# Patient Record
Sex: Male | Born: 1960 | Race: White | Hispanic: No | Marital: Single | Smoking: Never smoker
Health system: Southern US, Community
[De-identification: ages and names within clinical notes are randomized; demographics above are authoritative.]

## PROBLEM LIST (undated history)

## (undated) DIAGNOSIS — M109 Gout, unspecified: Secondary | ICD-10-CM

## (undated) DIAGNOSIS — I10 Essential (primary) hypertension: Secondary | ICD-10-CM

## (undated) DIAGNOSIS — G4730 Sleep apnea, unspecified: Secondary | ICD-10-CM

## (undated) DIAGNOSIS — E119 Type 2 diabetes mellitus without complications: Secondary | ICD-10-CM

## (undated) DIAGNOSIS — K276 Chronic or unspecified peptic ulcer, site unspecified, with both hemorrhage and perforation: Secondary | ICD-10-CM

## (undated) HISTORY — PX: NASAL POLYP SURGERY: SHX186

---

## 2020-05-30 ENCOUNTER — Other Ambulatory Visit (HOSPITAL_COMMUNITY): Payer: Self-pay | Admitting: Occupational Medicine

## 2020-05-30 ENCOUNTER — Ambulatory Visit (HOSPITAL_COMMUNITY)
Admission: RE | Admit: 2020-05-30 | Discharge: 2020-05-30 | Disposition: A | Discharge status: Home / Self Care | Source: Ambulatory Visit | Attending: Occupational Medicine | Admitting: Occupational Medicine

## 2020-05-30 ENCOUNTER — Other Ambulatory Visit: Payer: Self-pay

## 2020-05-30 DIAGNOSIS — R7612 Nonspecific reaction to cell mediated immunity measurement of gamma interferon antigen response without active tuberculosis: Secondary | ICD-10-CM | POA: Diagnosis present

## 2020-06-02 ENCOUNTER — Encounter (HOSPITAL_COMMUNITY): Payer: Self-pay

## 2020-06-02 ENCOUNTER — Ambulatory Visit (HOSPITAL_COMMUNITY)
Admission: RE | Admit: 2020-06-02 | Discharge: 2020-06-02 | Disposition: A | Discharge status: Home / Self Care | Source: Ambulatory Visit

## 2020-06-02 ENCOUNTER — Other Ambulatory Visit: Payer: Self-pay

## 2020-06-02 VITALS — BP 132/78 | HR 61 | Temp 98.1°F | Resp 18

## 2020-06-02 DIAGNOSIS — S61313A Laceration without foreign body of left middle finger with damage to nail, initial encounter: Secondary | ICD-10-CM

## 2020-06-02 NOTE — ED Provider Notes (Signed)
[facility]    CSN: [id number] Arrival date & time: 06/02/20  0841      History   Chief Complaint Chief Complaint  Patient presents with   Appointment   Laceration    HPI Mitchell Carr is a 59 y.o. male.   Patient is a 59 year old male who presents today with left middle finger avulsion.  This occurred approximately 2 days ago.  Reporting he was cutting some, and and sliced the tip of the left middle finger and part of the nail.  He has been cleaning, holding pressure to stop bleeding.  Reporting blood pretty consistently over the past day or so but seems to have stopped at this point.  He put liquid Band-Aid on the open area and wrapped with nonstick gauze and finger condom.  Has sensation to finger.  Not really having any pain.   ROS per HPI      History reviewed. No pertinent past medical history.  There are no problems to display for this patient.   History reviewed. No pertinent surgical history.     Home Medications    Prior to Admission medications   Not on File    Family History Family History  Family history unknown: Yes    Social History Social History   Tobacco Use   Smoking status: Never Smoker  Substance Use Topics   Alcohol use: Not on file   Drug use: Not on file     Allergies   Patient has no known allergies.   Review of Systems Review of Systems   Physical Exam Triage Vital Signs ED Triage Vitals  Enc Vitals Group     BP 06/02/20 0911 132/78     Pulse Rate 06/02/20 0911 61     Resp 06/02/20 0911 18     Temp 06/02/20 0911 98.1 F (36.7 C)     Temp Source 06/02/20 0911 Oral     SpO2 06/02/20 0911 98 %     Weight --      Height --      Head Circumference --      Peak Flow --      Pain Score 06/02/20 0912 4     Pain Loc --      Pain Edu? --      Excl. in GC? --    No data found.  Updated Vital Signs BP 132/78 (BP Location: Right Arm)    Pulse 61    Temp 98.1 F (36.7 C) (Oral)    Resp 18    SpO2  98%   Visual Acuity Right Eye Distance:   Left Eye Distance:   Bilateral Distance:    Right Eye Near:   Left Eye Near:    Bilateral Near:     Physical Exam Vitals and nursing note reviewed.  Constitutional:      Appearance: Normal appearance.  HENT:     Head: Normocephalic and atraumatic.     Nose: Nose normal.  Eyes:     Conjunctiva/sclera: Conjunctivae normal.  Pulmonary:     Effort: Pulmonary effort is normal.  Musculoskeletal:        General: Normal range of motion.     Cervical back: Normal range of motion.  Skin:    General: Skin is warm and dry.     Comments: Finger wrapped in gauze with finger condom in place.   Neurological:     Mental Status: He is alert.  Psychiatric:  Mood and Affect: Mood normal.      UC Treatments / Results  Labs (all labs ordered are listed, but only abnormal results are displayed) Labs Reviewed - No data to display  EKG   Radiology No results found.  Procedures Procedures (including critical care time)  Medications Ordered in UC Medications - No data to display  Initial Impression / Assessment and Plan / UC Course  I have reviewed the triage vital signs and the nursing notes.  Pertinent labs & imaging results that were available during my care of the patient were reviewed by me and considered in my medical decision making (see chart for details).     Laceration/avulsion of left middle finger x 2 days.  Bleeding currently stopped and controlled at this time. Spoke with patient about everything he has been doing and reassured that he is doing all the right things.  Recommended not take the dressing off due to bleeding has finally stopped.  Concerned that if we take the dressing off that the bleeding may restart.  He can leave the dressing on for a day or 2 to see if this helps. Watch for signs of infection.  Recommended follow-up with orthopedic hand specialist for any continued or worsening issues. Final Clinical  Impressions(s) / UC Diagnoses   Final diagnoses:  Laceration of left middle finger without foreign body with damage to nail, initial encounter     Discharge Instructions     Keep treating your finger as you have been Recommend following up with [facility] for any continued issues    ED Prescriptions    None     PDMP not reviewed this encounter.   Janace Aris, NP 06/02/20 1248

## 2020-06-02 NOTE — Discharge Instructions (Addendum)
Keep treating your finger as you have been Recommend following up with [facility] for any continued issues

## 2020-06-02 NOTE — ED Triage Notes (Signed)
Pt presents with left middle finger laceration X 2 nights ago: pt states he was cutting some cabbage and loss control of the knife and sliced his nail & finger.

## 2021-04-20 ENCOUNTER — Other Ambulatory Visit: Payer: Self-pay

## 2021-04-20 ENCOUNTER — Encounter (HOSPITAL_COMMUNITY): Payer: Self-pay

## 2021-04-20 ENCOUNTER — Ambulatory Visit (HOSPITAL_COMMUNITY)
Admission: EM | Admit: 2021-04-20 | Discharge: 2021-04-20 | Disposition: A | Discharge status: Home / Self Care | Attending: Physician Assistant | Admitting: Physician Assistant

## 2021-04-20 ENCOUNTER — Ambulatory Visit (INDEPENDENT_AMBULATORY_CARE_PROVIDER_SITE_OTHER)

## 2021-04-20 DIAGNOSIS — L97529 Non-pressure chronic ulcer of other part of left foot with unspecified severity: Secondary | ICD-10-CM

## 2021-04-20 DIAGNOSIS — L97519 Non-pressure chronic ulcer of other part of right foot with unspecified severity: Secondary | ICD-10-CM | POA: Diagnosis not present

## 2021-04-20 DIAGNOSIS — L089 Local infection of the skin and subcutaneous tissue, unspecified: Secondary | ICD-10-CM | POA: Diagnosis not present

## 2021-04-20 DIAGNOSIS — L97509 Non-pressure chronic ulcer of other part of unspecified foot with unspecified severity: Secondary | ICD-10-CM | POA: Insufficient documentation

## 2021-04-20 DIAGNOSIS — E119 Type 2 diabetes mellitus without complications: Secondary | ICD-10-CM | POA: Diagnosis present

## 2021-04-20 DIAGNOSIS — M79671 Pain in right foot: Secondary | ICD-10-CM

## 2021-04-20 DIAGNOSIS — Z794 Long term (current) use of insulin: Secondary | ICD-10-CM | POA: Diagnosis present

## 2021-04-20 DIAGNOSIS — L97512 Non-pressure chronic ulcer of other part of right foot with fat layer exposed: Secondary | ICD-10-CM | POA: Diagnosis not present

## 2021-04-20 DIAGNOSIS — M79672 Pain in left foot: Secondary | ICD-10-CM | POA: Diagnosis not present

## 2021-04-20 DIAGNOSIS — E11621 Type 2 diabetes mellitus with foot ulcer: Secondary | ICD-10-CM | POA: Insufficient documentation

## 2021-04-20 DIAGNOSIS — L97522 Non-pressure chronic ulcer of other part of left foot with fat layer exposed: Secondary | ICD-10-CM | POA: Diagnosis not present

## 2021-04-20 DIAGNOSIS — L97502 Non-pressure chronic ulcer of other part of unspecified foot with fat layer exposed: Secondary | ICD-10-CM | POA: Insufficient documentation

## 2021-04-20 HISTORY — DX: Type 2 diabetes mellitus without complications: E11.9

## 2021-04-20 LAB — CBC WITH DIFFERENTIAL/PLATELET
Abs Immature Granulocytes: 0.03 10*3/uL (ref 0.00–0.07)
Basophils Absolute: 0 10*3/uL (ref 0.0–0.1)
Basophils Relative: 1 %
Eosinophils Absolute: 0.7 10*3/uL — ABNORMAL HIGH (ref 0.0–0.5)
Eosinophils Relative: 8 %
HCT: 41.2 % (ref 39.0–52.0)
Hemoglobin: 13.7 g/dL (ref 13.0–17.0)
Immature Granulocytes: 0 %
Lymphocytes Relative: 27 %
Lymphs Abs: 2.3 10*3/uL (ref 0.7–4.0)
MCH: 29.7 pg (ref 26.0–34.0)
MCHC: 33.3 g/dL (ref 30.0–36.0)
MCV: 89.2 fL (ref 80.0–100.0)
Monocytes Absolute: 0.7 10*3/uL (ref 0.1–1.0)
Monocytes Relative: 8 %
Neutro Abs: 4.8 10*3/uL (ref 1.7–7.7)
Neutrophils Relative %: 56 %
Platelets: 209 10*3/uL (ref 150–400)
RBC: 4.62 MIL/uL (ref 4.22–5.81)
RDW: 13 % (ref 11.5–15.5)
WBC: 8.6 10*3/uL (ref 4.0–10.5)
nRBC: 0 % (ref 0.0–0.2)

## 2021-04-20 LAB — COMPREHENSIVE METABOLIC PANEL
ALT: 83 U/L — ABNORMAL HIGH (ref 0–44)
AST: 51 U/L — ABNORMAL HIGH (ref 15–41)
Albumin: 3.8 g/dL (ref 3.5–5.0)
Alkaline Phosphatase: 55 U/L (ref 38–126)
Anion gap: 11 (ref 5–15)
BUN: 41 mg/dL — ABNORMAL HIGH (ref 8–23)
CO2: 21 mmol/L — ABNORMAL LOW (ref 22–32)
Calcium: 9.2 mg/dL (ref 8.9–10.3)
Chloride: 105 mmol/L (ref 98–111)
Creatinine, Ser: 1.75 mg/dL — ABNORMAL HIGH (ref 0.61–1.24)
GFR, Estimated: 44 mL/min — ABNORMAL LOW (ref >60)
Glucose, Bld: 284 mg/dL — ABNORMAL HIGH (ref 70–99)
Potassium: 4.5 mmol/L (ref 3.5–5.1)
Sodium: 137 mmol/L (ref 135–145)
Total Bilirubin: 0.8 mg/dL (ref 0.3–1.2)
Total Protein: 7 g/dL (ref 6.5–8.1)

## 2021-04-20 MED ORDER — DOXYCYCLINE HYCLATE 100 MG PO CAPS: 100.0000 mg | ORAL_CAPSULE | Freq: Two times a day (BID) | ORAL | 0 refills | Status: DC

## 2021-04-20 MED ORDER — CEFTRIAXONE SODIUM 1 G IJ SOLR
INTRAMUSCULAR | Status: AC
  Filled 2021-04-20: qty 10

## 2021-04-20 MED ORDER — CEFTRIAXONE SODIUM 1 G IJ SOLR
1.0000 g | Freq: Once | INTRAMUSCULAR | Status: AC
  Administered 2021-04-20: 1 g via INTRAMUSCULAR

## 2021-04-20 MED ORDER — LIDOCAINE HCL (PF) 1 % IJ SOLN
INTRAMUSCULAR | Status: AC
  Filled 2021-04-20: qty 2

## 2021-04-20 NOTE — ED Triage Notes (Signed)
Pt reports redness in the big toes x 2 weeks. Reports the nail in the left big toe came off and the nail in the right big toe is coming off.

## 2021-04-20 NOTE — Discharge Instructions (Addendum)
We have given you an injection of an antibiotic today.  Please start doxycycline tonight.  Stay out of the sun while on this medication as it will make you sensitive to the sun.  Wear roomy footwear.  Please call podiatry first thing tomorrow to schedule an appointment.  If you have any worsening symptoms including increased pain, fevers, nausea, vomiting you need to go to the emergency room.

## 2021-04-20 NOTE — ED Provider Notes (Signed)
[facility]    CSN: [id number] Arrival date & time: 04/20/21  1641      History   Chief Complaint No chief complaint on file.   HPI Mitchell Carr is a 60 y.o. male.   Patient presents today with a several week history of swelling and drainage from bilateral great toes.  Reports symptoms began after he was in [place] working in the heat.  Reports it was hot enough that the rubber on his shoes melted it was soon after that that symptoms began.  Patient does have a history of diabetes and has not been checking his blood sugars regularly.  His last A1c available in care everywhere obtained 11/22/2019 was elevated at 8.5%.  He does have some baseline neuropathy and denies any increase in numbness or tingling.  He denies any fevers, chest pain, shortness of breath, headaches, dizziness, nausea, vomiting.  Denies any recent antibiotic use.  He is not followed by podiatry but is open to seeing specialist.  Reports pain is rated 7 on a 0-10 pain scale, localized to toes bilaterally, described as aching/throbbing, worse with prolonged ambulation, no alleviating factors identified.   Past Medical History:  Diagnosis Date   Diabetes mellitus without complication (HCC)     There are no problems to display for this patient.   Past Surgical History:  Procedure Laterality Date   NASAL POLYP SURGERY         Home Medications    Prior to Admission medications   Medication Sig Start Date End Date Taking? Authorizing Provider  doxycycline (VIBRAMYCIN) 100 MG capsule Take 1 capsule (100 mg total) by mouth 2 (two) times daily. 04/20/21  Yes Isabelle Matt, Noberto Retort, PA-C  Cholecalciferol 125 MCG (5000 UT) capsule Take 1 capsule by mouth every evening.    [provider]  citalopram (CELEXA) 20 MG tablet citalopram 20 mg tablet    [provider]  Exenatide ER (BYDUREON BCISE) 2 MG/0.85ML AUIJ Bydureon BCise 2 mg/0.85 mL subcutaneous auto-injector    [provider]   insulin glargine (LANTUS SOLOSTAR) 100 UNIT/ML Solostar Pen Lantus Solostar U-100 Insulin 100 unit/mL (3 mL) subcutaneous pen    [provider]  lisinopril (ZESTRIL) 10 MG tablet lisinopril 10 mg tablet    [provider]  Multiple Vitamins-Minerals (THERA-M) TABS Take 1 tablet by mouth every evening.    [provider]  Omega-3 Fatty Acids (FISH OIL) 1200 MG CAPS Take by mouth.    [provider]  rosuvastatin (CRESTOR) 20 MG tablet rosuvastatin 20 mg tablet    [provider]  sildenafil (VIAGRA) 100 MG tablet sildenafil 100 mg tablet    [provider]  tamsulosin (FLOMAX) 0.4 MG CAPS capsule tamsulosin 0.4 mg capsule    [provider]  traMADol (ULTRAM) 50 MG tablet tramadol 50 mg tablet    [provider]  traZODone (DESYREL) 100 MG tablet Take 100 mg by mouth at bedtime. 02/26/21   [provider]  zolpidem (AMBIEN) 10 MG tablet zolpidem 10 mg tablet    [provider]    Family History Family History  Problem Relation Age of Onset   Healthy Mother    Healthy Father     Social History Social History   Tobacco Use   Smoking status: Never   Smokeless tobacco: Never  Substance Use Topics   Alcohol use: Yes     Allergies   Patient has no known allergies.   Review of Systems Review of  Systems  Constitutional:  Positive for activity change. Negative for appetite change, fatigue and fever.  Respiratory:  Negative for cough and shortness of breath.   Cardiovascular:  Negative for chest pain.  Gastrointestinal:  Negative for abdominal pain, diarrhea, nausea and vomiting.  Skin:  Positive for color change and wound.  Neurological:  Positive for numbness. Negative for dizziness, weakness, light-headedness and headaches.    Physical Exam Triage Vital Signs ED Triage Vitals  Enc Vitals Group     BP 04/20/21 1734 136/76     Pulse Rate 04/20/21 1734 67     Resp 04/20/21 1734 18      Temp 04/20/21 1734 98.8 F (37.1 C)     Temp Source 04/20/21 1734 Oral     SpO2 04/20/21 1734 96 %     Weight --      Height --      Head Circumference --      Peak Flow --      Pain Score 04/20/21 1729 7     Pain Loc --      Pain Edu? --      Excl. in GC? --    No data found.  Updated Vital Signs BP 136/76 (BP Location: Right Arm)   Pulse 67   Temp 98.8 F (37.1 C) (Oral)   Resp 18   SpO2 96%   Visual Acuity Right Eye Distance:   Left Eye Distance:   Bilateral Distance:    Right Eye Near:   Left Eye Near:    Bilateral Near:     Physical Exam Vitals reviewed.  Constitutional:      General: He is awake.     Appearance: Normal appearance. He is normal weight. He is not ill-appearing.     Comments: Very pleasant male appears stated age in no acute distress  HENT:     Head: Normocephalic and atraumatic.  Cardiovascular:     Rate and Rhythm: Normal rate and regular rhythm.     Pulses:          Posterior tibial pulses are 2+ on the right side and 2+ on the left side.     Heart sounds: Normal heart sounds, S1 normal and S2 normal. No murmur heard. Pulmonary:     Effort: Pulmonary effort is normal.     Breath sounds: Normal breath sounds. No stridor. No wheezing, rhonchi or rales.     Comments: Clear to auscultation bilaterally Feet:     Right foot:     Protective Sensation: 10 sites tested.  4 sites sensed.     Skin integrity: Ulcer, blister, skin breakdown, erythema and warmth present.     Left foot:     Protective Sensation: 10 sites tested.  4 sites sensed.     Skin integrity: Ulcer, blister, skin breakdown, erythema and warmth present.  Neurological:     Mental Status: He is alert.  Psychiatric:        Behavior: Behavior is cooperative.         UC Treatments / Results  Labs (all labs ordered are listed, but only abnormal results are displayed) Labs Reviewed  COMPREHENSIVE METABOLIC PANEL  CBC WITH DIFFERENTIAL/PLATELET    EKG   Radiology DG  Foot 2 Views Left  Result Date: 04/20/2021 CLINICAL DATA:  Pain and swelling of the toes, BILATERAL foot pain for 1.5 weeks, diabetes mellitus EXAM: LEFT FOOT - 2 VIEW COMPARISON:  None FINDINGS: Soft tissue swelling at great toe. Osseous mineralization low  normal. Joint spaces preserved. Plantar and Achilles insertion calcaneal spurs. No acute fracture, dislocation, or bone destruction. IMPRESSION: No acute abnormalities. Calcaneal spurring. Electronically Signed   By: Ulyses Southward M.D.   On: 04/20/2021 18:55   DG Foot 2 Views Right  Result Date: 04/20/2021 CLINICAL DATA:  Pain and swelling of the toes, BILATERAL foot pain for 1.5 weeks, diabetes mellitus EXAM: RIGHT FOOT - 2 VIEW COMPARISON:  None FINDINGS: Osseous mineralization normal. Joint spaces preserved. No acute fracture, dislocation, or bone destruction. Plantar and Achilles insertion calcaneal spur formation. IMPRESSION: No acute osseous abnormalities. Calcaneal spurring. Electronically Signed   By: Ulyses Southward M.D.   On: 04/20/2021 18:56    Procedures Procedures (including critical care time)  Medications Ordered in UC Medications  cefTRIAXone (ROCEPHIN) injection 1 g (1 g Intramuscular Given 04/20/21 1934)    Initial Impression / Assessment and Plan / UC Course  I have reviewed the triage vital signs and the nursing notes.  Pertinent labs & imaging results that were available during my care of the patient were reviewed by me and considered in my medical decision making (see chart for details).     X-rays obtained to monitor for osteomyelitis showed no acute changes.  Concern for severe infection and so patient was given 1 g of Rocephin in office and started on doxycycline at home.  He was instructed to avoid prolonged sun exposure with doxycycline due to sensitivity associated with this medication.  Discussed concern for ascending infection and he is to follow-up with podiatry as soon as possible.  Urgent referral was placed and he  was given contact information for [facility] and instructed to call them first thing in the morning.  Discussed that if symptoms persist despite antibiotics or if he develops any systemic symptoms including nausea, vomiting, fever he needs to go to the emergency room to consider IV antibiotics.  Discussed at length alarm symptoms that warrant emergent evaluation.  Strict return precautions given to which patient expressed understanding.  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  Please start doxycycline tonight.  Stay out of the sun while on this medication as it will make you sensitive to the sun.  Wear roomy footwear.  Please call podiatry first thing tomorrow to schedule an appointment.  If you have any worsening symptoms including increased pain, fevers, nausea, vomiting you need to go to the emergency room.     ED Prescriptions     Medication Sig Dispense Auth. Provider   doxycycline (VIBRAMYCIN) 100 MG capsule Take 1 capsule (100 mg total) by mouth 2 (two) times daily. 20 capsule Jamina Macbeth, Noberto Retort, PA-C      PDMP not reviewed this encounter.   Jeani Hawking, PA-C 04/20/21 1942

## 2021-04-21 ENCOUNTER — Ambulatory Visit (HOSPITAL_COMMUNITY): Payer: Self-pay

## 2021-04-24 ENCOUNTER — Ambulatory Visit: Admitting: Podiatry

## 2021-04-24 ENCOUNTER — Other Ambulatory Visit: Payer: Self-pay

## 2021-04-24 ENCOUNTER — Encounter: Payer: Self-pay | Admitting: Podiatry

## 2021-04-24 DIAGNOSIS — L03032 Cellulitis of left toe: Secondary | ICD-10-CM | POA: Diagnosis not present

## 2021-04-24 DIAGNOSIS — L02612 Cutaneous abscess of left foot: Secondary | ICD-10-CM | POA: Diagnosis not present

## 2021-04-25 NOTE — Progress Notes (Signed)
Subjective:   Patient ID: Mitchell Carr, male   DOB: 60 y.o.   MRN: [id number]   HPI Patient states that he was working in high temperatures with an inadequate shoe and developed irritation of his left big toe right big toe went to urgent care is on doxycycline and it improved but he is worried because he is a long-term diabetic under reasonably good control but running A1c of around 8.  Patient does not smoke likes to be active   Review of Systems  All other systems reviewed and are negative.      Objective:  Physical Exam Vitals and nursing note reviewed.  Constitutional:      Appearance: He is well-developed.  Pulmonary:     Effort: Pulmonary effort is normal.  Musculoskeletal:        General: Normal range of motion.  Skin:    General: Skin is warm.  Neurological:     Mental Status: He is alert.    Neurovascular status was found to be intact muscle strength was found to be adequate range of motion adequate.  Patient does have discoloration of the left hallux with mild localized swelling and a blister on the medial side with discoloration right but not to the same degree with loss of the left toenail.  No proximal edema erythema or drainage was noted     Assessment:  Low-grade abscess formation of the left big toe secondary to trauma with mild discomfort right with appearance that both are responding well to antibiotic X     Plan:  H&P education rendered concerning condition and debridement of tissue accomplished with no pus formation.  Continue doxycycline soaks and open toed shoes and this should heal uneventfully but will reappoint if any changes were to occur and he does understand his risk of ultimate amputation depending on course of treatment

## 2021-06-07 IMAGING — CR DG CHEST 2V
2 series · 2 of 2 positions shown · non-contrast
Comparison: None.

CLINICAL DATA: Positive QuantiFERON

EXAM:
CHEST - 2 VIEW

[w chest pa]
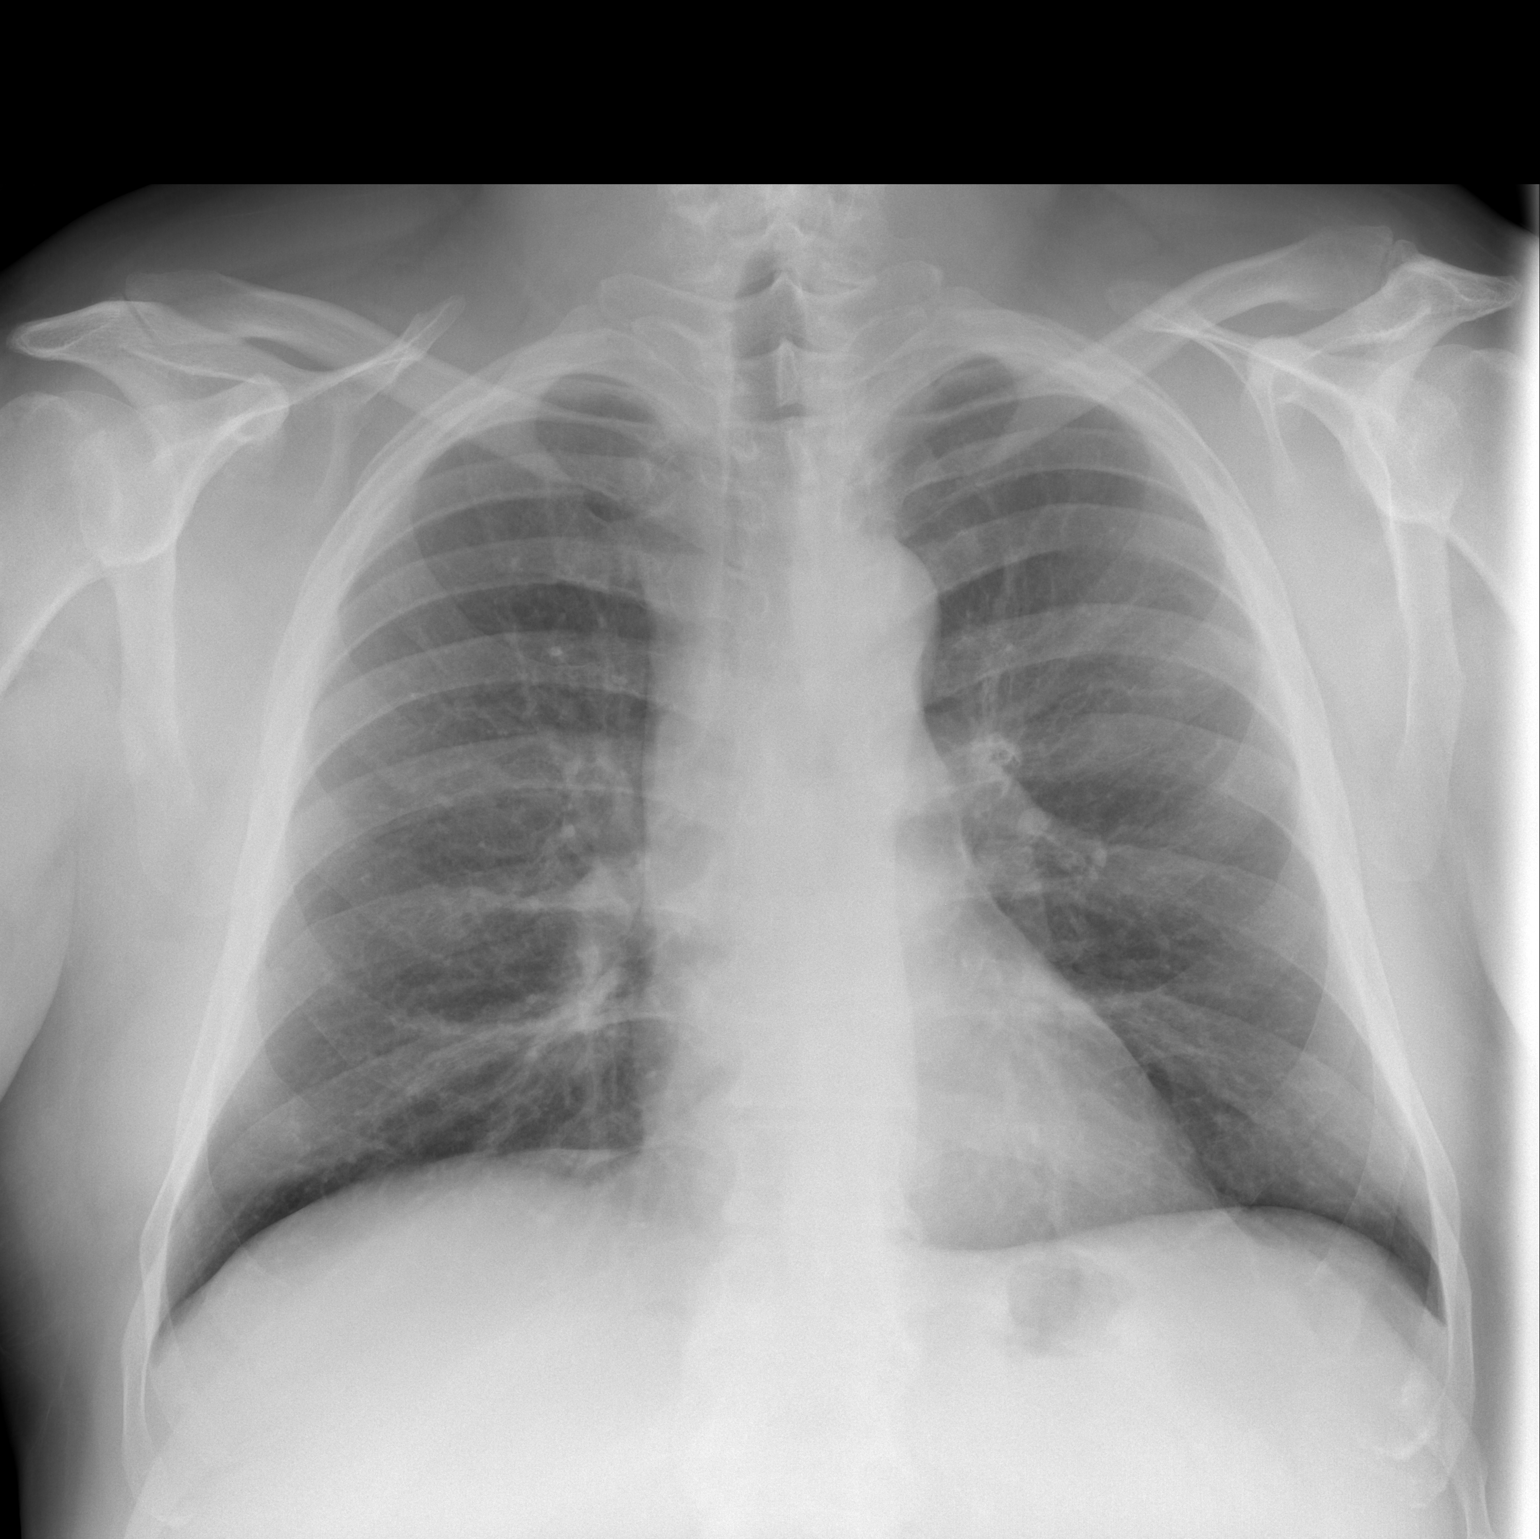

[w chest lat]
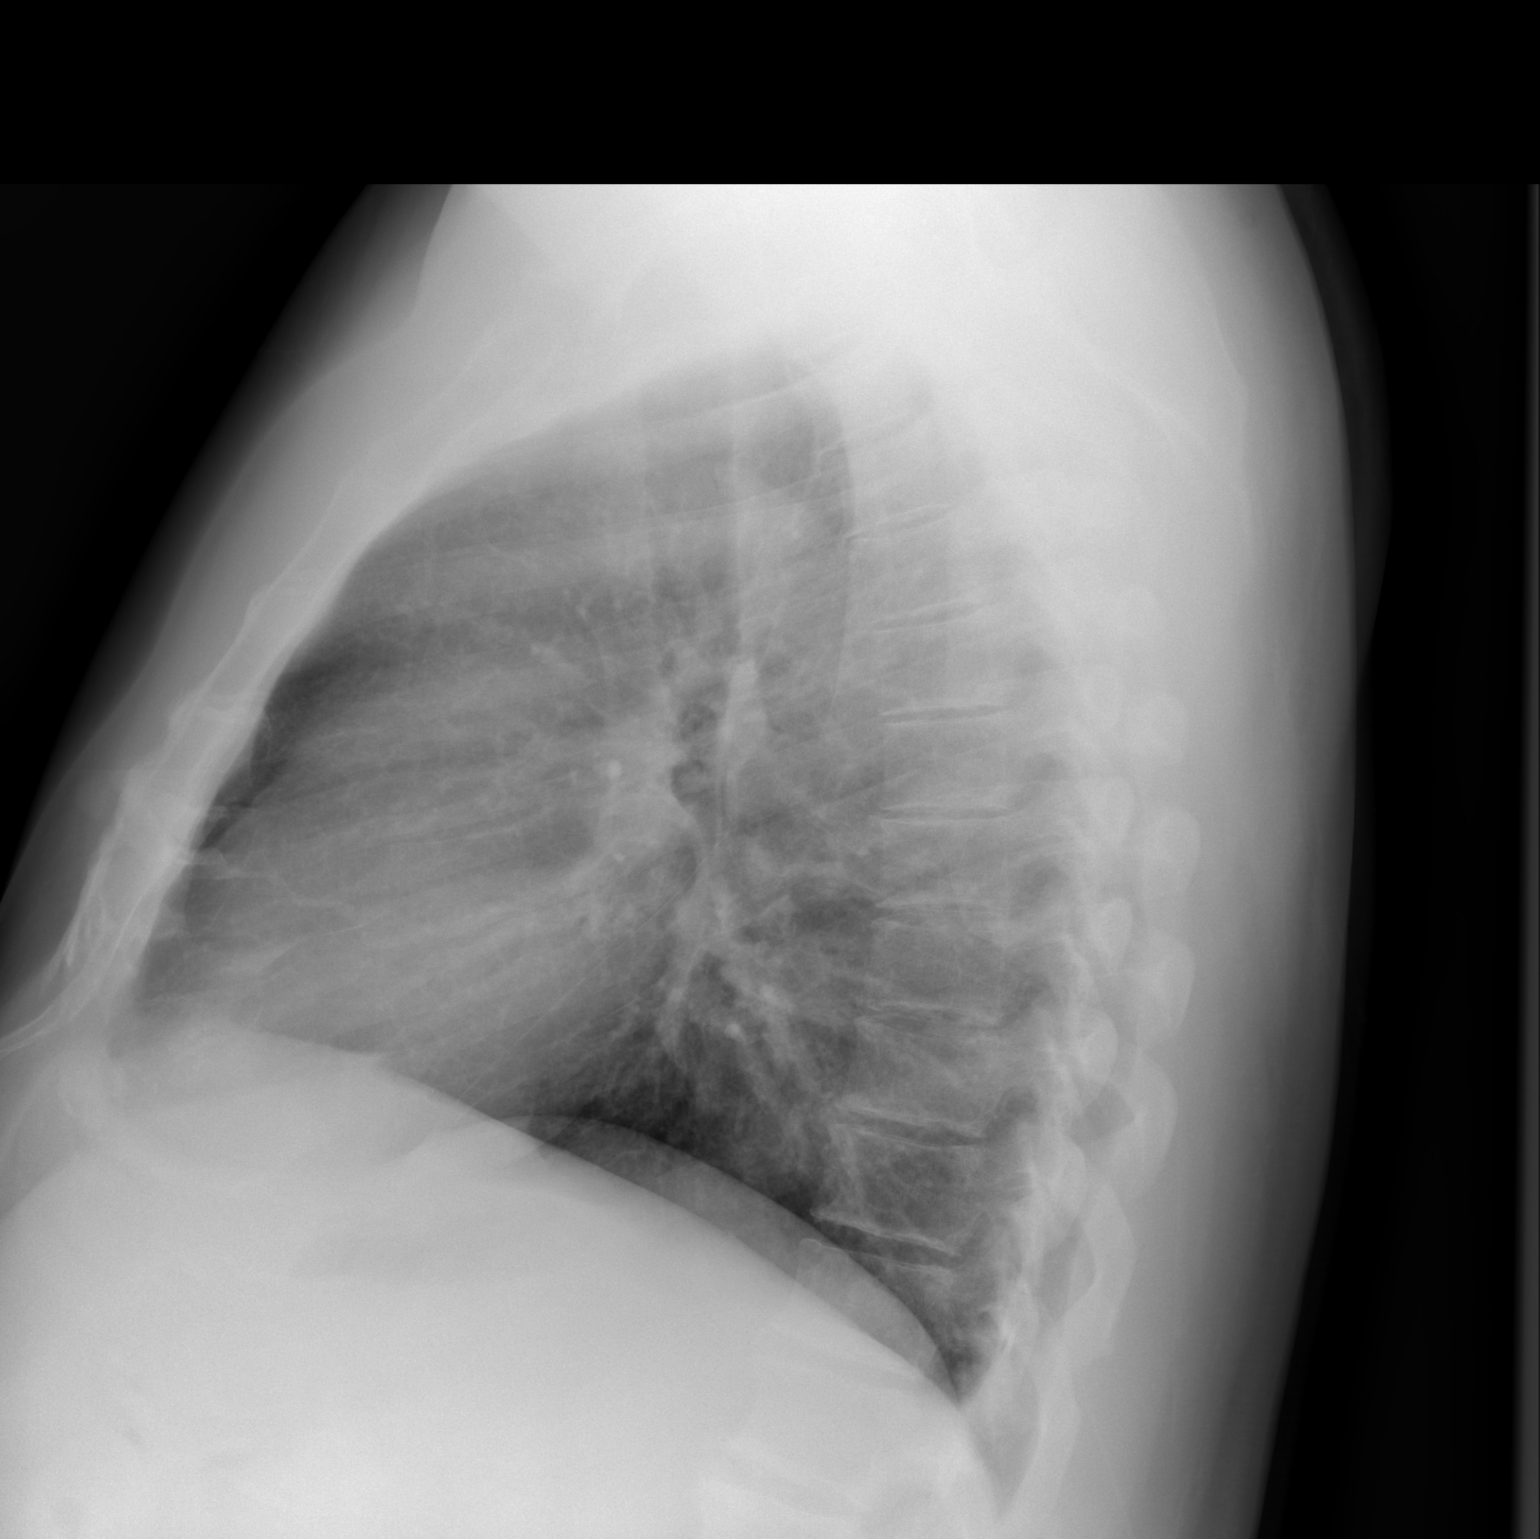

[2 of 2 positions shown; findings below may reference images not displayed]

FINDINGS: The heart size and mediastinal contours are within normal limits.
Both lungs are clear. The visualized skeletal structures are
unremarkable.
IMPRESSION: No active cardiopulmonary disease.

## 2021-07-07 ENCOUNTER — Encounter (HOSPITAL_COMMUNITY): Payer: Self-pay

## 2021-07-07 ENCOUNTER — Emergency Department (HOSPITAL_COMMUNITY)

## 2021-07-07 ENCOUNTER — Emergency Department (HOSPITAL_COMMUNITY)
Admission: EM | Admit: 2021-07-07 | Discharge: 2021-07-07 | Disposition: A | Discharge status: Home / Self Care | Attending: Emergency Medicine | Admitting: Emergency Medicine

## 2021-07-07 ENCOUNTER — Other Ambulatory Visit: Payer: Self-pay

## 2021-07-07 DIAGNOSIS — U071 COVID-19: Secondary | ICD-10-CM | POA: Insufficient documentation

## 2021-07-07 DIAGNOSIS — Z794 Long term (current) use of insulin: Secondary | ICD-10-CM | POA: Diagnosis not present

## 2021-07-07 DIAGNOSIS — E119 Type 2 diabetes mellitus without complications: Secondary | ICD-10-CM | POA: Diagnosis not present

## 2021-07-07 DIAGNOSIS — J029 Acute pharyngitis, unspecified: Secondary | ICD-10-CM

## 2021-07-07 HISTORY — DX: Chronic or unspecified peptic ulcer, site unspecified, with both hemorrhage and perforation: K27.6

## 2021-07-07 LAB — CBC WITH DIFFERENTIAL/PLATELET
Abs Immature Granulocytes: 0.01 10*3/uL (ref 0.00–0.07)
Basophils Absolute: 0 10*3/uL (ref 0.0–0.1)
Basophils Relative: 0 %
Eosinophils Absolute: 0.1 10*3/uL (ref 0.0–0.5)
Eosinophils Relative: 2 %
HCT: 42.1 % (ref 39.0–52.0)
Hemoglobin: 14 g/dL (ref 13.0–17.0)
Immature Granulocytes: 0 %
Lymphocytes Relative: 21 %
Lymphs Abs: 1.2 10*3/uL (ref 0.7–4.0)
MCH: 29.9 pg (ref 26.0–34.0)
MCHC: 33.3 g/dL (ref 30.0–36.0)
MCV: 90 fL (ref 80.0–100.0)
Monocytes Absolute: 0.7 10*3/uL (ref 0.1–1.0)
Monocytes Relative: 13 %
Neutro Abs: 3.7 10*3/uL (ref 1.7–7.7)
Neutrophils Relative %: 64 %
Platelets: 122 10*3/uL — ABNORMAL LOW (ref 150–400)
RBC: 4.68 MIL/uL (ref 4.22–5.81)
RDW: 13 % (ref 11.5–15.5)
WBC: 5.7 10*3/uL (ref 4.0–10.5)
nRBC: 0 % (ref 0.0–0.2)

## 2021-07-07 LAB — BASIC METABOLIC PANEL
Anion gap: 8 (ref 5–15)
BUN: 24 mg/dL — ABNORMAL HIGH (ref 8–23)
CO2: 25 mmol/L (ref 22–32)
Calcium: 9 mg/dL (ref 8.9–10.3)
Chloride: 104 mmol/L (ref 98–111)
Creatinine, Ser: 1.6 mg/dL — ABNORMAL HIGH (ref 0.61–1.24)
GFR, Estimated: 49 mL/min — ABNORMAL LOW (ref >60)
Glucose, Bld: 244 mg/dL — ABNORMAL HIGH (ref 70–99)
Potassium: 4.7 mmol/L (ref 3.5–5.1)
Sodium: 137 mmol/L (ref 135–145)

## 2021-07-07 LAB — GROUP A STREP BY PCR: Group A Strep by PCR: NOT DETECTED

## 2021-07-07 LAB — CBG MONITORING, ED: Glucose-Capillary: 261 mg/dL — ABNORMAL HIGH (ref 70–99)

## 2021-07-07 MED ORDER — SODIUM CHLORIDE 0.9 % IV BOLUS
1000.0000 mL | Freq: Once | INTRAVENOUS | Status: AC
  Administered 2021-07-07: 1000 mL via INTRAVENOUS

## 2021-07-07 MED ORDER — LIDOCAINE VISCOUS HCL 2 % MT SOLN
15.0000 mL | Freq: Once | OROMUCOSAL | Status: AC
  Administered 2021-07-07: 15 mL via OROMUCOSAL
  Filled 2021-07-07: qty 15

## 2021-07-07 MED ORDER — CELECOXIB 100 MG PO CAPS: 100.0000 mg | ORAL_CAPSULE | Freq: Two times a day (BID) | ORAL | 0 refills | Status: DC | PRN

## 2021-07-07 MED ORDER — LIDOCAINE VISCOUS HCL 2 % MT SOLN: 10.0000 mL | Freq: Four times a day (QID) | OROMUCOSAL | 1 refills | Status: AC | PRN

## 2021-07-07 MED ORDER — KETOROLAC TROMETHAMINE 30 MG/ML IJ SOLN
30.0000 mg | Freq: Once | INTRAMUSCULAR | Status: AC
  Administered 2021-07-07: 30 mg via INTRAVENOUS
  Filled 2021-07-07: qty 1

## 2021-07-07 NOTE — Discharge Instructions (Addendum)
Contact a health care provider if: You have large, tender lumps in your neck. You have a rash. You cough up green, yellow-brown, or bloody spit. Get help right away if: Your neck becomes stiff. You drool or are unable to swallow liquids. You cannot drink or take medicines without vomiting. You have severe pain that does not go away, even after you take medicine. You have trouble breathing, and it is not caused by a stuffy nose. You have new pain and swelling in your joints such as the knees, ankles, wrists, or elbows.

## 2021-07-07 NOTE — ED Provider Notes (Signed)
[facility] EMERGENCY DEPARTMENT Provider Note   CSN: [id number] Arrival date & time: 07/07/21  1638     History No chief complaint on file.   Mitchell Carr is a 60 y.o. male who presents emergency department chief complaint of sore throat.  The patient has a past medical history of diabetes.  He was around his grandchildren this past weekend who all were symptomatic.  His wife and him both began having symptoms this past Monday.  She tested COVID-positive.  He took a test 2 days ago and tested positive for coronavirus.  His symptoms were mild until 2 days ago when he began having a very bad sore throat.  He has significant pain with swallowing and today states that he is trying to avoid all activities that involve swallowing.  He is taken Motrin and Tylenol without relief of his symptoms.  He is able to tolerate his own saliva however states that it hurts severely.  He denies nausea, vomiting, fevers, shortness of breath.  He does have a decreased p.o. intake.  HPI     Past Medical History:  Diagnosis Date   Diabetes mellitus without complication (HCC)     There are no problems to display for this patient.   Past Surgical History:  Procedure Laterality Date   NASAL POLYP SURGERY         Family History  Problem Relation Age of Onset   Healthy Mother    Healthy Father     Social History   Tobacco Use   Smoking status: Never   Smokeless tobacco: Never  Substance Use Topics   Alcohol use: Yes    Home Medications Prior to Admission medications   Medication Sig Start Date End Date Taking? Authorizing Provider  Cholecalciferol 125 MCG (5000 UT) capsule Take 1 capsule by mouth every evening.    [provider]  citalopram (CELEXA) 20 MG tablet citalopram 20 mg tablet    [provider]  doxycycline (VIBRAMYCIN) 100 MG capsule Take 1 capsule (100 mg total) by mouth 2 (two) times daily. 04/20/21   Raspet, Noberto Retort, PA-C  Exenatide ER  (BYDUREON BCISE) 2 MG/0.85ML AUIJ Bydureon BCise 2 mg/0.85 mL subcutaneous auto-injector    [provider]  insulin glargine (LANTUS SOLOSTAR) 100 UNIT/ML Solostar Pen Lantus Solostar U-100 Insulin 100 unit/mL (3 mL) subcutaneous pen    [provider]  lisinopril (ZESTRIL) 10 MG tablet lisinopril 10 mg tablet    [provider]  Multiple Vitamins-Minerals (THERA-M) TABS Take 1 tablet by mouth every evening.    [provider]  Omega-3 Fatty Acids (FISH OIL) 1200 MG CAPS Take by mouth.    [provider]  rosuvastatin (CRESTOR) 20 MG tablet rosuvastatin 20 mg tablet    [provider]  sildenafil (VIAGRA) 100 MG tablet sildenafil 100 mg tablet    [provider]  tamsulosin (FLOMAX) 0.4 MG CAPS capsule tamsulosin 0.4 mg capsule    [provider]  traMADol (ULTRAM) 50 MG tablet tramadol 50 mg tablet    [provider]  traZODone (DESYREL) 100 MG tablet Take 100 mg by mouth at bedtime. 02/26/21   [provider]  zolpidem (AMBIEN) 10 MG tablet zolpidem 10 mg tablet    [provider]    Allergies    Patient has no known allergies.  Review of Systems   Review of Systems Ten systems reviewed and are negative for acute change, except as noted in the HPI.  Physical Exam Updated Vital Signs BP 106/61   Pulse 69   Temp 99.7 F (37.6 C) (Oral)   Resp 11   SpO2 93%   Physical Exam Vitals and nursing note reviewed.  Constitutional:      General: He is not in acute distress.    Appearance: He is well-developed. He is ill-appearing. He is not diaphoretic.  HENT:     Head: Normocephalic and atraumatic.     Mouth/Throat:     Pharynx: Uvula midline. Pharyngeal swelling and posterior oropharyngeal erythema present. No oropharyngeal exudate.  Eyes:     General: No scleral icterus.    Conjunctiva/sclera: Conjunctivae normal.  Cardiovascular:     Rate and Rhythm: Normal rate and regular rhythm.      Heart sounds: Normal heart sounds.  Pulmonary:     Effort: Pulmonary effort is normal. No respiratory distress.     Breath sounds: Normal breath sounds.  Abdominal:     Palpations: Abdomen is soft.     Tenderness: There is no abdominal tenderness.  Musculoskeletal:     Cervical back: Normal range of motion and neck supple.  Skin:    General: Skin is warm and dry.  Neurological:     Mental Status: He is alert.  Psychiatric:        Behavior: Behavior normal.    ED Results / Procedures / Treatments   Labs (all labs ordered are listed, but only abnormal results are displayed) Labs Reviewed  CBG MONITORING, ED - Abnormal; Notable for the following components:      Result Value   Glucose-Capillary 261 (*)    All other components within normal limits  BASIC METABOLIC PANEL  CBC WITH DIFFERENTIAL/PLATELET    EKG None  Radiology No results found.  Procedures Procedures   Medications Ordered in ED Medications  sodium chloride 0.9 % bolus 1,000 mL (has no administration in time range)  ketorolac (TORADOL) 30 MG/ML injection 30 mg (has no administration in time range)  lidocaine (XYLOCAINE) 2 % viscous mouth solution 15 mL (has no administration in time range)    ED Course  I have reviewed the triage vital signs and the nursing notes.  Pertinent labs & imaging results that were available during my care of the patient were reviewed by me and considered in my medical decision making (see chart for details).    MDM Rules/Calculators/A&P                           60 year old male here with complaint of sore throat.  He has known positive COVID diagnosis.  I ordered and reviewed labs that included CBC with mild thrombocytopenia of insignificant value otherwise within normal limits.  BMP shows glucose of 244, mildly elevated creatinine and BUN which are the patient's baseline.  Group A strep test is negative. Patient is tolerating his own secretions and I have low suspicion  for peritonsillar abscess or other deep space infection.  He was given shot of Toradol as well as fluids for rehydration and viscous lidocaine which gave him significant symptomatic relief.  Given the fact that he has a markedly elevated blood glucose I have refrained from giving Decadron which typically would be very helpful with his uvular swelling.  Patient will be discharged with a very short course of anti-inflammatories given his baseline renal insufficiency and he is advised to hydrate sufficiently.  He will also be discharged with viscous lidocaine for symptomatic treatment.  Feel  this should resolve within the next few days.  I discussed supportive care and return precautions with the patient appears otherwise appropriate for discharge at this time. Final Clinical Impression(s) / ED Diagnoses Final diagnoses:  None    Rx / DC Orders ED Discharge Orders     None        Arthor Captain, PA-C 07/07/21 1426    Ernie Avena, MD 07/07/21 [phone]

## 2021-07-07 NOTE — ED Triage Notes (Signed)
"  Covid symptoms started on Monday, tested positive yesterday, now throat really sore I can't hardly swallow and just don't feel good" per pt

## 2022-01-03 ENCOUNTER — Encounter (HOSPITAL_COMMUNITY): Payer: Self-pay

## 2022-01-03 ENCOUNTER — Other Ambulatory Visit: Payer: Self-pay

## 2022-01-03 ENCOUNTER — Ambulatory Visit (HOSPITAL_COMMUNITY)
Admission: EM | Admit: 2022-01-03 | Discharge: 2022-01-03 | Disposition: A | Discharge status: Home / Self Care | Attending: Family Medicine | Admitting: Family Medicine

## 2022-01-03 DIAGNOSIS — R195 Other fecal abnormalities: Secondary | ICD-10-CM | POA: Diagnosis not present

## 2022-01-03 DIAGNOSIS — K5900 Constipation, unspecified: Secondary | ICD-10-CM | POA: Insufficient documentation

## 2022-01-03 LAB — CBC
HCT: 43.6 % (ref 39.0–52.0)
Hemoglobin: 14.6 g/dL (ref 13.0–17.0)
MCH: 29.6 pg (ref 26.0–34.0)
MCHC: 33.5 g/dL (ref 30.0–36.0)
MCV: 88.3 fL (ref 80.0–100.0)
Platelets: 243 10*3/uL (ref 150–400)
RBC: 4.94 MIL/uL (ref 4.22–5.81)
RDW: 11.9 % (ref 11.5–15.5)
WBC: 7.5 10*3/uL (ref 4.0–10.5)
nRBC: 0 % (ref 0.0–0.2)

## 2022-01-03 MED ORDER — OMEPRAZOLE 40 MG PO CPDR: 40.0000 mg | DELAYED_RELEASE_CAPSULE | Freq: Every day | ORAL | 1 refills | Status: AC

## 2022-01-03 NOTE — Discharge Instructions (Addendum)
We have drawn a CBC, or blood count today.  Staff will call you if there is significant anemia.  Take omeprazole 40 mg, 1 daily for reducing stomach acid.  Please avoid taking ibuprofen, naproxen, or aspirin in the next 1 to 2 weeks  Take MiraLAX, 1 capful daily dissolved in liquid.  You can take this chronically if needed  Try using a Dulcolax suppository for constipation first

## 2022-01-03 NOTE — ED Provider Notes (Addendum)
[facility]    CSN: [id number] Arrival date & time: 01/03/22  1406      History   Chief Complaint Chief Complaint  Patient presents with   Constipation    HPI Mitchell Carr is a 61 y.o. male.    Constipation Here for some dark stools that he is seen in the last couple of days.  He has had constipation for about 2 weeks, and was trying some milk of magnesia for relief.  It did not help much.  He had had a little indigestion or heartburn, so he took some Pepto for small 2-3 nights ago.  He did throw up once some bilious liquid, but he has been eating okay otherwise.  He does feel bloated due to the constipation.  No fever or chills.   About 2 years ago he did have melena and was found to have a bleeding duodenal ulcer.  He also had become very anemic.  He states currently he is not feeling like that, with no fatigue or weakness.  He did have a colonoscopy about 4 years ago that was clear  He currently is not having any abdominal pain, including no burning pain  Past Medical History:  Diagnosis Date   Bleeding ulcer with perforation (HCC)    Diabetes mellitus without complication (HCC)     There are no problems to display for this patient.   Past Surgical History:  Procedure Laterality Date   NASAL POLYP SURGERY         Home Medications    Prior to Admission medications   Medication Sig Start Date End Date Taking? Authorizing Provider  omeprazole (PRILOSEC) 40 MG capsule Take 1 capsule (40 mg total) by mouth daily. 01/03/22  Yes Zenia Resides, MD  Cholecalciferol 125 MCG (5000 UT) capsule Take 1 capsule by mouth every evening.    [provider]  citalopram (CELEXA) 20 MG tablet citalopram 20 mg tablet    [provider]  Exenatide ER (BYDUREON BCISE) 2 MG/0.85ML AUIJ Bydureon BCise 2 mg/0.85 mL subcutaneous auto-injector    [provider]  insulin glargine (LANTUS SOLOSTAR) 100 UNIT/ML Solostar Pen Lantus Solostar U-100  Insulin 100 unit/mL (3 mL) subcutaneous pen    [provider]  lidocaine (XYLOCAINE) 2 % solution Use as directed 10 mLs in the mouth or throat every 6 (six) hours as needed for mouth pain. 07/07/21   Harris, Cammy Copa, PA-C  lisinopril (ZESTRIL) 10 MG tablet lisinopril 10 mg tablet    [provider]  Multiple Vitamins-Minerals (THERA-M) TABS Take 1 tablet by mouth every evening.    [provider]  Omega-3 Fatty Acids (FISH OIL) 1200 MG CAPS Take by mouth.    [provider]  rosuvastatin (CRESTOR) 20 MG tablet rosuvastatin 20 mg tablet    [provider]  sildenafil (VIAGRA) 100 MG tablet sildenafil 100 mg tablet    [provider]  tamsulosin (FLOMAX) 0.4 MG CAPS capsule tamsulosin 0.4 mg capsule    [provider]  traMADol (ULTRAM) 50 MG tablet tramadol 50 mg tablet    [provider]  traZODone (DESYREL) 100 MG tablet Take 100 mg by mouth at bedtime. 02/26/21   [provider]  zolpidem (AMBIEN) 10 MG tablet zolpidem 10 mg tablet    [provider]    Family History Family History  Problem Relation Age of Onset   Healthy Mother    Healthy Father     Social History Social History  Tobacco Use   Smoking status: Never   Smokeless tobacco: Never  Substance Use Topics   Alcohol use: Yes     Allergies   Patient has no known allergies.   Review of Systems Review of Systems  Gastrointestinal:  Positive for constipation.    Physical Exam Triage Vital Signs ED Triage Vitals  Enc Vitals Group     BP 01/03/22 1426 (!) 151/85     Pulse Rate 01/03/22 1426 80     Resp 01/03/22 1426 18     Temp 01/03/22 1426 98.3 F (36.8 C)     Temp Source 01/03/22 1426 Oral     SpO2 01/03/22 1426 96 %     Weight --      Height --      Head Circumference --      Peak Flow --      Pain Score 01/03/22 1428 0     Pain Loc --      Pain Edu? --      Excl. in GC? --    No data found.  Updated Vital  Signs BP (!) 151/85 (BP Location: Left Arm)    Pulse 80    Temp 98.3 F (36.8 C) (Oral)    Resp 18    SpO2 96%   Visual Acuity Right Eye Distance:   Left Eye Distance:   Bilateral Distance:    Right Eye Near:   Left Eye Near:    Bilateral Near:     Physical Exam Vitals reviewed.  Constitutional:      General: He is not in acute distress.    Appearance: He is not toxic-appearing.  HENT:     Mouth/Throat:     Mouth: Mucous membranes are moist.     Comments: No pallor Eyes:     Extraocular Movements: Extraocular movements intact.     Pupils: Pupils are equal, round, and reactive to light.  Cardiovascular:     Rate and Rhythm: Normal rate and regular rhythm.     Heart sounds: No murmur heard. Pulmonary:     Effort: Pulmonary effort is normal.     Breath sounds: Normal breath sounds.  Abdominal:     General: There is no distension.     Palpations: Abdomen is soft. There is no mass.     Tenderness: There is no abdominal tenderness. There is no guarding.  Musculoskeletal:     Cervical back: Neck supple.  Lymphadenopathy:     Cervical: No cervical adenopathy.  Skin:    Capillary Refill: Capillary refill takes less than 2 seconds.     Coloration: Skin is not jaundiced or pale.  Neurological:     General: No focal deficit present.     Mental Status: He is alert and oriented to person, place, and time.  Psychiatric:        Behavior: Behavior normal.     UC Treatments / Results  Labs (all labs ordered are listed, but only abnormal results are displayed) Labs Reviewed  CBC    EKG   Radiology No results found.  Procedures Procedures (including critical care time)  Medications Ordered in UC Medications - No data to display  Initial Impression / Assessment and Plan / UC Course  I have reviewed the triage vital signs and the nursing notes.  Pertinent labs & imaging results that were available during my care of the patient were reviewed by me and considered in my  medical decision making (see chart for details).  We discussed possibly sending him to the ER for urgent lab evaluation.  Since overall he is not symptomatic consistent with a bleeding ulcer at this time, we decided to do a blood count here.  And to begin a PPI.  Also he will avoid NSAIDs.  He is aware that Pepto has the potential for causing dark stools  He will go to the ER if symptoms worsen or he does become weak or dizzy, or if he begins throwing up coffee-ground's, or has more pronounced dark loose stools Final Clinical Impressions(s) / UC Diagnoses   Final diagnoses:  Constipation, unspecified constipation type  Dark stools     Discharge Instructions      We have drawn a CBC, or blood count today.  Staff will call you if there is significant anemia.  Take omeprazole 40 mg, 1 daily for reducing stomach acid.  Please avoid taking ibuprofen, naproxen, or aspirin in the next 1 to 2 weeks  Take MiraLAX, 1 capful daily dissolved in liquid.  You can take this chronically if needed  Try using a Dulcolax suppository for constipation first     ED Prescriptions     Medication Sig Dispense Auth. Provider   omeprazole (PRILOSEC) 40 MG capsule Take 1 capsule (40 mg total) by mouth daily. 30 capsule Zenia Resides, MD      PDMP not reviewed this encounter.   Zenia Resides, MD 01/03/22 1514    Zenia Resides, MD 01/03/22 [phone]

## 2022-01-03 NOTE — ED Triage Notes (Signed)
Pt c/o constipation x2wks. States took milk of magnesia with relief then developed heart burn so he took peptobismol and then he saw dark stool. States hx same when he had a bleeding ulcer. Pt c/o discomfort and bloated from constipation.

## 2022-01-19 ENCOUNTER — Emergency Department (HOSPITAL_COMMUNITY)

## 2022-01-19 ENCOUNTER — Other Ambulatory Visit: Payer: Self-pay

## 2022-01-19 ENCOUNTER — Emergency Department (HOSPITAL_COMMUNITY)
Admission: EM | Admit: 2022-01-19 | Discharge: 2022-01-19 | Disposition: A | Discharge status: Home / Self Care | Attending: Emergency Medicine | Admitting: Emergency Medicine

## 2022-01-19 ENCOUNTER — Encounter (HOSPITAL_COMMUNITY): Payer: Self-pay | Admitting: Emergency Medicine

## 2022-01-19 DIAGNOSIS — S99921A Unspecified injury of right foot, initial encounter: Secondary | ICD-10-CM

## 2022-01-19 DIAGNOSIS — S90921A Unspecified superficial injury of right foot, initial encounter: Secondary | ICD-10-CM | POA: Insufficient documentation

## 2022-01-19 DIAGNOSIS — X58XXXA Exposure to other specified factors, initial encounter: Secondary | ICD-10-CM | POA: Diagnosis not present

## 2022-01-19 DIAGNOSIS — Z794 Long term (current) use of insulin: Secondary | ICD-10-CM | POA: Insufficient documentation

## 2022-01-19 MED ORDER — OXYCODONE-ACETAMINOPHEN 5-325 MG PO TABS: 1.0000 | ORAL_TABLET | Freq: Four times a day (QID) | ORAL | 0 refills | Status: AC | PRN

## 2022-01-19 NOTE — Discharge Instructions (Addendum)
Call your primary care doctor or specialist as discussed in the next 2-3 days.   Return immediately back to the ER if:  Your symptoms worsen within the next 12-24 hours. You develop new symptoms such as new fevers, persistent vomiting, new pain, shortness of breath, or new weakness or numbness, or if you have any other concerns.  

## 2022-01-19 NOTE — ED Provider Notes (Signed)
?[facility] EMERGENCY DEPARTMENT ?Provider Note ? ? ?CSN: [id number] ?Arrival date & time: 01/19/22  1826 ? ?  ? ?History ? ?Chief Complaint  ?Patient presents with  ? Foot Injury  ? ? ?Mitchell Carr is a 61 y.o. male. ? ?Patient presented chief complaint of right ankle and foot pain.  He states that he injured it while coming home 2 days ago.  Has continued pain there and swelling.  Denies any fevers or cough or vomiting or diarrhea denies pain elsewhere. ? ? ?  ? ?Home Medications ?Prior to Admission medications   ?Medication Sig Start Date End Date Taking? Authorizing Provider  ?oxyCODONE-acetaminophen (PERCOCET/ROXICET) 5-325 MG tablet Take 1 tablet by mouth every 6 (six) hours as needed for up to 5 doses for severe pain. 01/19/22  Yes Cheryll Cockayne, MD  ?Cholecalciferol 125 MCG (5000 UT) capsule Take 1 capsule by mouth every evening.    [provider]  ?citalopram (CELEXA) 20 MG tablet citalopram 20 mg tablet    [provider]  ?Exenatide ER (BYDUREON BCISE) 2 MG/0.85ML AUIJ Bydureon BCise 2 mg/0.85 mL subcutaneous auto-injector    [provider]  ?insulin glargine (LANTUS SOLOSTAR) 100 UNIT/ML Solostar Pen Lantus Solostar U-100 Insulin 100 unit/mL (3 mL) subcutaneous pen    [provider]  ?lidocaine (XYLOCAINE) 2 % solution Use as directed 10 mLs in the mouth or throat every 6 (six) hours as needed for mouth pain. 07/07/21   Arthor Captain, PA-C  ?lisinopril (ZESTRIL) 10 MG tablet lisinopril 10 mg tablet    [provider]  ?Multiple Vitamins-Minerals (THERA-M) TABS Take 1 tablet by mouth every evening.    [provider]  ?Omega-3 Fatty Acids (FISH OIL) 1200 MG CAPS Take by mouth.    [provider]  ?omeprazole (PRILOSEC) 40 MG capsule Take 1 capsule (40 mg total) by mouth daily. 01/03/22   Zenia Resides, MD  ?rosuvastatin (CRESTOR) 20 MG tablet rosuvastatin 20 mg tablet    [provider]  ?sildenafil (VIAGRA)  100 MG tablet sildenafil 100 mg tablet    [provider]  ?tamsulosin (FLOMAX) 0.4 MG CAPS capsule tamsulosin 0.4 mg capsule    [provider]  ?traMADol (ULTRAM) 50 MG tablet tramadol 50 mg tablet    [provider]  ?traZODone (DESYREL) 100 MG tablet Take 100 mg by mouth at bedtime. 02/26/21   [provider]  ?zolpidem (AMBIEN) 10 MG tablet zolpidem 10 mg tablet    [provider]  ?   ? ?Allergies    ?Patient has no known allergies.   ? ?Review of Systems   ?Review of Systems  ?Constitutional:  Negative for fever.  ?HENT:  Negative for ear pain and sore throat.   ?Eyes:  Negative for pain.  ?Respiratory:  Negative for cough.   ?Cardiovascular:  Negative for chest pain.  ?Gastrointestinal:  Negative for abdominal pain.  ?Genitourinary:  Negative for flank pain.  ?Musculoskeletal:  Negative for back pain.  ?Skin:  Negative for color change and rash.  ?Neurological:  Negative for syncope.  ?All other systems reviewed and are negative. ? ?Physical Exam ?Updated Vital Signs ?BP (!) 141/76 (BP Location: Right Arm)   Pulse 69   Temp 98.4 ?F (36.9 ?C) (Oral)   Resp 16   Ht 6' (1.829 m)   Wt 117.9 kg   SpO2 100%   BMI 35.25 kg/m?  ?Physical Exam ?Constitutional:   ?   Appearance: He is well-developed.  ?  HENT:  ?   Head: Normocephalic.  ?   Nose: Nose normal.  ?Eyes:  ?   Extraocular Movements: Extraocular movements intact.  ?Cardiovascular:  ?   Rate and Rhythm: Normal rate.  ?Pulmonary:  ?   Effort: Pulmonary effort is normal.  ?Musculoskeletal:  ?   Comments: Tenderness and swelling to the right ankle.  Range of motion mildly decreased secondary to pain.  No cellulitis noted no abnormal warmth noted.  No laceration noted.  Neurovascularly intact otherwise.  Compartments soft.  ?Skin: ?   Coloration: Skin is not jaundiced.  ?Neurological:  ?   Mental Status: He is alert. Mental status is at baseline.  ? ? ?ED Results / Procedures / Treatments   ?Labs ?(all labs  ordered are listed, but only abnormal results are displayed) ?Labs Reviewed - No data to display ? ?EKG ?None ? ?Radiology ?DG Ankle Complete Right ? ?Result Date: 01/19/2022 ?CLINICAL DATA:  Foot and ankle injury EXAM: RIGHT ANKLE - COMPLETE 3+ VIEW COMPARISON:  None. FINDINGS: There is no evidence of fracture, dislocation, or joint effusion. There is no evidence of focal bone abnormality. Soft tissues are unremarkable. Dorsal degenerative changes. Small plantar calcaneal spur IMPRESSION: No acute osseous abnormality Electronically Signed   By: Jasmine Pang M.D.   On: 01/19/2022 19:48  ? ?DG Foot Complete Right ? ?Result Date: 01/19/2022 ?CLINICAL DATA:  Foot and ankle injury EXAM: RIGHT FOOT COMPLETE - 3+ VIEW COMPARISON:  None. FINDINGS: No fracture or malalignment. Mild degenerative changes at the first MTP joint. The soft tissues are unremarkable. Small plantar calcaneal spur IMPRESSION: No acute osseous abnormality Electronically Signed   By: Jasmine Pang M.D.   On: 01/19/2022 19:37   ? ?Procedures ?Procedures  ? ? ?Medications Ordered in ED ?Medications - No data to display ? ?ED Course/ Medical Decision Making/ A&P ?  ?                        ?Medical Decision Making ?Amount and/or Complexity of Data Reviewed ?Radiology: ordered. ? ? ?History obtained from the wife at bedside as well. ?Patient seen in outpatient clinic for between second 2023 for constipation. ?X-rays of the right ankle and right foot unremarkable per radiology.  No acute findings noted no fracture noted.  Patient states the maximum pain is at the dorsum of the right foot. ? ?Patient offered crutches, advised continued outpatient follow-up with his doctor within the week.,  Advising immediate return for worsening symptoms or any additional concerns. ? ? ? ? ? ? ? ?Final Clinical Impression(s) / ED Diagnoses ?Final diagnoses:  ?Injury of right foot, initial encounter  ? ? ?Rx / DC Orders ?ED Discharge Orders   ? ?      Ordered  ?   oxyCODONE-acetaminophen (PERCOCET/ROXICET) 5-325 MG tablet  Every 6 hours PRN       ? 01/19/22 2018  ? ?  ?  ? ?  ? ? ?  ?Cheryll Cockayne, MD ?01/19/22 2018 ? ?

## 2022-01-19 NOTE — ED Triage Notes (Signed)
Patient coming from home, complaint of a right foot/ankle injury Wednesday. Patient states it has been getting worse today, endorses swelling to extremity.  ?

## 2022-04-27 ENCOUNTER — Ambulatory Visit (HOSPITAL_COMMUNITY)
Admission: EM | Admit: 2022-04-27 | Discharge: 2022-04-27 | Disposition: A | Discharge status: Home / Self Care | Attending: Physician Assistant | Admitting: Physician Assistant

## 2022-04-27 ENCOUNTER — Encounter (HOSPITAL_COMMUNITY): Payer: Self-pay | Admitting: Emergency Medicine

## 2022-04-27 DIAGNOSIS — M7022 Olecranon bursitis, left elbow: Secondary | ICD-10-CM

## 2022-04-27 HISTORY — DX: Gout, unspecified: M10.9

## 2022-04-27 HISTORY — DX: Essential (primary) hypertension: I10

## 2022-04-27 MED ORDER — HYDROCODONE-ACETAMINOPHEN 5-325 MG PO TABS: 1.0000 | ORAL_TABLET | ORAL | 0 refills | Status: AC | PRN

## 2022-04-27 MED ORDER — PREDNISONE 50 MG PO TABS: ORAL_TABLET | ORAL | 0 refills | Status: DC

## 2022-04-27 NOTE — Discharge Instructions (Signed)
Return if any problems.

## 2022-04-27 NOTE — ED Provider Notes (Signed)
[facility]    CSN: [id number] Arrival date & time: 04/27/22  7673      History   Chief Complaint Chief Complaint  Patient presents with   Elbow Pain    HPI Mitchell Carr is a 61 y.o. male.   Pt complains of swelling to his left elbow.  Pt reports pain with movement.  Pt reports elbow feels warm.  Denies any injury.  He does frequently put his elbows down and rest on them.  Denies any fever or chills he has not had any injuries he has not had any cuts to the area.  Patient has had gout in his foot in the past  The history is provided by the patient. No language interpreter was used.    Past Medical History:  Diagnosis Date   Bleeding ulcer with perforation (HCC)    Diabetes mellitus without complication (HCC)    Gout    Hypertension     There are no problems to display for this patient.   Past Surgical History:  Procedure Laterality Date   NASAL POLYP SURGERY         Home Medications    Prior to Admission medications   Medication Sig Start Date End Date Taking? Authorizing Provider  Cholecalciferol 125 MCG (5000 UT) capsule Take 1 capsule by mouth every evening.   Yes [provider]  Exenatide ER (BYDUREON BCISE) 2 MG/0.85ML AUIJ once a week.   Yes [provider]  HYDROcodone-acetaminophen (NORCO/VICODIN) 5-325 MG tablet Take 1 tablet by mouth every 4 (four) hours as needed for moderate pain. 04/27/22 04/27/23 Yes Cheron Schaumann K, PA-C  insulin glargine (LANTUS SOLOSTAR) 100 UNIT/ML Solostar Pen Lantus Solostar U-100 Insulin 100 unit/mL (3 mL) subcutaneous pen   Yes [provider]  lisinopril (ZESTRIL) 10 MG tablet lisinopril 10 mg tablet   Yes [provider]  Multiple Vitamins-Minerals (THERA-M) TABS Take 1 tablet by mouth every evening.   Yes [provider]  Omega-3 Fatty Acids (FISH OIL) 1200 MG CAPS Take by mouth.   Yes [provider]  predniSONE (DELTASONE) 50 MG tablet One tablet a day  04/27/22  Yes Anuradha Chabot, Verlon Au K, PA-C  rosuvastatin (CRESTOR) 20 MG tablet rosuvastatin 20 mg tablet   Yes [provider]  tamsulosin (FLOMAX) 0.4 MG CAPS capsule tamsulosin 0.4 mg capsule   Yes [provider]  traMADol (ULTRAM) 50 MG tablet tramadol 50 mg tablet   Yes [provider]  traZODone (DESYREL) 100 MG tablet Take 100 mg by mouth at bedtime. 02/26/21  Yes [provider]  citalopram (CELEXA) 20 MG tablet citalopram 20 mg tablet    [provider]  lidocaine (XYLOCAINE) 2 % solution Use as directed 10 mLs in the mouth or throat every 6 (six) hours as needed for mouth pain. 07/07/21   Arthor Captain, PA-C  omeprazole (PRILOSEC) 40 MG capsule Take 1 capsule (40 mg total) by mouth daily. 01/03/22   Zenia Resides, MD  oxyCODONE-acetaminophen (PERCOCET/ROXICET) 5-325 MG tablet Take 1 tablet by mouth every 6 (six) hours as needed for up to 5 doses for severe pain. 01/19/22   Cheryll Cockayne, MD  sildenafil (VIAGRA) 100 MG tablet sildenafil 100 mg tablet    [provider]  zolpidem (AMBIEN) 10 MG tablet zolpidem 10 mg tablet    [provider]    Family History Family History  Problem Relation Age of Onset   Healthy Mother    Healthy Father  Social History Social History   Tobacco Use   Smoking status: Never   Smokeless tobacco: Never  Substance Use Topics   Alcohol use: Yes     Allergies   Patient has no known allergies.   Review of Systems Review of Systems  All other systems reviewed and are negative.    Physical Exam Triage Vital Signs ED Triage Vitals  Enc Vitals Group     BP 04/27/22 0902 (!) 154/82     Pulse Rate 04/27/22 0902 (!) 56     Resp 04/27/22 0902 18     Temp 04/27/22 0902 97.9 F (36.6 C)     Temp Source 04/27/22 0902 Oral     SpO2 04/27/22 0902 94 %     Weight --      Height --      Head Circumference --      Peak Flow --      Pain Score 04/27/22 0905 10     Pain Loc --       Pain Edu? --      Excl. in [place]? --    No data found.  Updated Vital Signs BP (!) 154/82 (BP Location: Right Arm)   Pulse (!) 56   Temp 97.9 F (36.6 C) (Oral)   Resp 18   SpO2 94%   Visual Acuity Right Eye Distance:   Left Eye Distance:   Bilateral Distance:    Right Eye Near:   Left Eye Near:    Bilateral Near:     Physical Exam Vitals reviewed.  Constitutional:      Appearance: Normal appearance.  Musculoskeletal:        General: Swelling and tenderness present.     Comments: Swollen tender left elbow slight erythema full range of motion neurovascular neurosensory are intact.  Elbow is swollen  Skin:    General: Skin is warm.  Neurological:     General: No focal deficit present.     Mental Status: He is alert.  Psychiatric:        Mood and Affect: Mood normal.      UC Treatments / Results  Labs (all labs ordered are listed, but only abnormal results are displayed) Labs Reviewed - No data to display  EKG   Radiology No results found.  Procedures Procedures (including critical care time)  Medications Ordered in UC Medications - No data to display  Initial Impression / Assessment and Plan / UC Course  I have reviewed the triage vital signs and the nursing notes.  Pertinent labs & imaging results that were available during my care of the patient were reviewed by me and considered in my medical decision making (see chart for details).     MDM I suspect patient has a olecranon bursitis I doubt cellulitis or joint infection.  Possible that patient could have gout to the elbow however think this is less likely been bursitis.  Patient is counseled on treatment he is given a prescription for prednisone and 12 Vicodin for pain he is advised to follow-up with his physician for recheck Final Clinical Impressions(s) / UC Diagnoses   Final diagnoses:  None     Discharge Instructions      Return if any problems    ED Prescriptions     Medication Sig  Dispense Auth. Provider   predniSONE (DELTASONE) 50 MG tablet One tablet a day 6 tablet Maverick Dieudonne K, [place]   HYDROcodone-acetaminophen (NORCO/VICODIN) 5-325 MG tablet Take 1 tablet by mouth  every 4 (four) hours as needed for moderate pain. 12 tablet Elson Areas, [place]      I have reviewed the PDMP during this encounter.   Elson Areas, [place] 04/27/22 1357

## 2022-04-27 NOTE — ED Triage Notes (Signed)
Patient c/o LFT elbow pain x 2 days ago.   Patient denies any fall, trauma, or any accidents that occurred.   Patient endorses increased pain with movement.   Patient endorses redness and swelling to site.   Patient endorses pain is radiating up and down the arm from elbow.   Patient took tramadol with no relief of symptoms.

## 2023-11-03 ENCOUNTER — Encounter (HOSPITAL_COMMUNITY): Payer: Self-pay | Admitting: Emergency Medicine

## 2023-11-03 ENCOUNTER — Ambulatory Visit (HOSPITAL_COMMUNITY): Admission: EM | Admit: 2023-11-03 | Discharge: 2023-11-03 | Disposition: A | Discharge status: Home / Self Care

## 2023-11-03 DIAGNOSIS — J069 Acute upper respiratory infection, unspecified: Secondary | ICD-10-CM | POA: Diagnosis not present

## 2023-11-03 DIAGNOSIS — R062 Wheezing: Secondary | ICD-10-CM | POA: Diagnosis not present

## 2023-11-03 HISTORY — DX: Sleep apnea, unspecified: G47.30

## 2023-11-03 MED ORDER — PREDNISONE 20 MG PO TABS: 40.0000 mg | ORAL_TABLET | Freq: Every day | ORAL | 0 refills | Status: AC

## 2023-11-03 MED ORDER — METHYLPREDNISOLONE ACETATE 40 MG/ML IJ SUSP
40.0000 mg | Freq: Once | INTRAMUSCULAR | Status: AC
  Administered 2023-11-03: 40 mg via INTRAMUSCULAR

## 2023-11-03 MED ORDER — METHYLPREDNISOLONE ACETATE 40 MG/ML IJ SUSP
INTRAMUSCULAR | Status: AC
  Filled 2023-11-03: qty 1

## 2023-11-03 NOTE — ED Provider Notes (Addendum)
[facility]    CSN: [id number] Arrival date & time: 11/03/23  1527      History   Chief Complaint Chief Complaint  Patient presents with   Cough   Sore Throat    HPI Mitchell Carr is a 62 y.o. male.   Patient presents today with more than 1 week history of congested cough, sore throat, and shortness of breath.  Also endorses low-grade fever of 100.3 F pretty consistently, however no bodyaches or chills.  No chest pain, runny or stuffy nose, headache, ear pain, abdominal pain, nausea/vomiting.  Reports he did have some diarrhea today.  He has been more tired than normal and is not eating as much is normal.  He was seen in alternate urgent care on 10/31/2023, tested negative for COVID-19, chest x-ray was negative.  Was treated with albuterol inhaler, Tessalon Perles, Promethazine DM, and has not improved very much.  Reports when he gets sick, he has had pneumonia in the past and typically gets bronchitis.    Past Medical History:  Diagnosis Date   Bleeding ulcer with perforation (HCC)    Diabetes mellitus without complication (HCC)    Gout    Hypertension    Sleep apnea     There are no active problems to display for this patient.   Past Surgical History:  Procedure Laterality Date   NASAL POLYP SURGERY         Home Medications    Prior to Admission medications   Medication Sig Start Date End Date Taking? Authorizing Provider  albuterol (VENTOLIN HFA) 108 (90 Base) MCG/ACT inhaler Inhale 1-2 puffs into the lungs every 6 (six) hours as needed for shortness of breath. 10/31/23  Yes [provider]  benzonatate (TESSALON) 200 MG capsule Take 200 mg by mouth 3 (three) times daily as needed for cough. 10/31/23 11/07/23 Yes [provider]  predniSONE (DELTASONE) 20 MG tablet Take 2 tablets (40 mg total) by mouth daily with breakfast for 5 days. 11/03/23 11/08/23 Yes Valentino Nose, NP  promethazine-dextromethorphan (PROMETHAZINE-DM)  6.25-15 MG/5ML syrup Take 5 mLs by mouth 4 (four) times daily as needed for cough. 10/31/23 11/07/23 Yes [provider]  Cholecalciferol 125 MCG (5000 UT) capsule Take 1 capsule by mouth every evening.    [provider]  citalopram (CELEXA) 20 MG tablet citalopram 20 mg tablet    [provider]  Exenatide ER (BYDUREON BCISE) 2 MG/0.85ML AUIJ once a week.    [provider]  insulin glargine (LANTUS SOLOSTAR) 100 UNIT/ML Solostar Pen Lantus Solostar U-100 Insulin 100 unit/mL (3 mL) subcutaneous pen    [provider]  lidocaine (XYLOCAINE) 2 % solution Use as directed 10 mLs in the mouth or throat every 6 (six) hours as needed for mouth pain. 07/07/21   Harris, Cammy Copa, PA-C  lisinopril (ZESTRIL) 10 MG tablet lisinopril 10 mg tablet    [provider]  Multiple Vitamins-Minerals (THERA-M) TABS Take 1 tablet by mouth every evening.    [provider]  Omega-3 Fatty Acids (FISH OIL) 1200 MG CAPS Take by mouth.    [provider]  omeprazole (PRILOSEC) 40 MG capsule Take 1 capsule (40 mg total) by mouth daily. 01/03/22   Zenia Resides, MD  oxyCODONE-acetaminophen (PERCOCET/ROXICET) 5-325 MG tablet Take 1 tablet by mouth every 6 (six) hours as needed for up to 5 doses for severe pain. 01/19/22   Cheryll Cockayne, MD  rosuvastatin (CRESTOR) 20 MG tablet rosuvastatin 20 mg tablet  [provider]  sildenafil (VIAGRA) 100 MG tablet sildenafil 100 mg tablet    [provider]  tamsulosin (FLOMAX) 0.4 MG CAPS capsule tamsulosin 0.4 mg capsule    [provider]  traMADol (ULTRAM) 50 MG tablet tramadol 50 mg tablet    [provider]  traZODone (DESYREL) 100 MG tablet Take 100 mg by mouth at bedtime. 02/26/21   [provider]  zolpidem (AMBIEN) 10 MG tablet zolpidem 10 mg tablet    [provider]    Family History Family History  Problem Relation Age of Onset   Healthy Mother     Healthy Father     Social History Social History   Tobacco Use   Smoking status: Never   Smokeless tobacco: Never  Substance Use Topics   Alcohol use: Yes     Allergies   Patient has no known allergies.   Review of Systems Review of Systems Per HPI  Physical Exam Triage Vital Signs ED Triage Vitals  Encounter Vitals Group     BP 11/03/23 1617 (!) 153/75     Systolic BP Percentile --      Diastolic BP Percentile --      Pulse Rate 11/03/23 1617 76     Resp 11/03/23 1617 18     Temp 11/03/23 1617 99.6 F (37.6 C)     Temp Source 11/03/23 1617 Oral     SpO2 11/03/23 1617 95 %     Weight --      Height --      Head Circumference --      Peak Flow --      Pain Score 11/03/23 1614 6     Pain Loc --      Pain Education --      Exclude from Growth Chart --    No data found.  Updated Vital Signs BP (!) 153/75 (BP Location: Left Arm)   Pulse 76   Temp 99.6 F (37.6 C) (Oral)   Resp 18   SpO2 95%   Visual Acuity Right Eye Distance:   Left Eye Distance:   Bilateral Distance:    Right Eye Near:   Left Eye Near:    Bilateral Near:     Physical Exam Vitals and nursing note reviewed.  Constitutional:      General: He is not in acute distress.    Appearance: Normal appearance. He is not ill-appearing or toxic-appearing.  HENT:     Head: Normocephalic and atraumatic.     Right Ear: Tympanic membrane, ear canal and external ear normal. No drainage, swelling or tenderness. No middle ear effusion. Tympanic membrane is not erythematous.     Left Ear: Tympanic membrane, ear canal and external ear normal. No drainage, swelling or tenderness.  No middle ear effusion. Tympanic membrane is not erythematous.     Nose: Congestion present. No rhinorrhea.     Mouth/Throat:     Mouth: Mucous membranes are moist.     Pharynx: Oropharynx is clear. No oropharyngeal exudate or posterior oropharyngeal erythema.  Eyes:     General: No scleral icterus.    Extraocular  Movements: Extraocular movements intact.  Cardiovascular:     Rate and Rhythm: Normal rate and regular rhythm.  Pulmonary:     Effort: Pulmonary effort is normal. No respiratory distress.     Breath sounds: Wheezing (faint expiratory wheezes) present. No rhonchi or rales.  Musculoskeletal:     Cervical back: Normal range of motion and neck  supple.  Lymphadenopathy:     Cervical: No cervical adenopathy.  Skin:    General: Skin is warm and dry.     Coloration: Skin is not jaundiced or pale.     Findings: No erythema or rash.  Neurological:     Mental Status: He is alert and oriented to person, place, and time.  Psychiatric:        Behavior: Behavior is cooperative.      UC Treatments / Results  Labs (all labs ordered are listed, but only abnormal results are displayed) Labs Reviewed - No data to display  EKG   Radiology No results found.  Procedures Procedures (including critical care time)  Medications Ordered in UC Medications  methylPREDNISolone acetate (DEPO-MEDROL) injection 40 mg (40 mg Intramuscular Given 11/03/23 1717)    Initial Impression / Assessment and Plan / UC Course  I have reviewed the triage vital signs and the nursing notes.  Pertinent labs & imaging results that were available during my care of the patient were reviewed by me and considered in my medical decision making (see chart for details).   Patient is well-appearing, normotensive, afebrile, not tachycardic, not tachypneic, oxygenating well on room air.    1. Viral URI with cough 2. Wheezing Overall, vitals and exam are reassuring  Continue supportive care with antitussives, Mucinex, albuterol inhaler Depo Medrol IM given today to help with lung inflammation Start oral prednisone tomorrow to help with lung inflammation given faint expiratory wheezes Return and ER precautions discussed  The patient was given the opportunity to ask questions.  All questions answered to their satisfaction.   The patient is in agreement to this plan.    Final Clinical Impressions(s) / UC Diagnoses   Final diagnoses:  Viral URI with cough  Wheezing     Discharge Instructions      You are seen today for a viral upper respiratory infection with cough and wheezing.  We gave you an injection of steroid medicine today to help with the wheezing.  Start oral prednisone tomorrow to help with lung inflammation.  Continue Mucinex, cough medications.  Seek care if symptoms do not improve with treatment.    ED Prescriptions     Medication Sig Dispense Auth. Provider   predniSONE (DELTASONE) 20 MG tablet Take 2 tablets (40 mg total) by mouth daily with breakfast for 5 days. 10 tablet Valentino Nose, NP      PDMP not reviewed this encounter.   Valentino Nose, NP 11/03/23 1722    Valentino Nose, NP 11/03/23 [phone]

## 2023-11-03 NOTE — ED Triage Notes (Addendum)
Pt has cough, sore throat for over a week. Tried taking Delsym and Mucinex. Went to UC in [facility], was given albuterol, Tessalon, and and promethazine DM. Reports hasn't noticed noticed change with symptoms. Reports chest xray and covid test that had done there were both good.

## 2023-11-03 NOTE — Discharge Instructions (Addendum)
You are seen today for a viral upper respiratory infection with cough and wheezing.  We gave you an injection of steroid medicine today to help with the wheezing.  Start oral prednisone tomorrow to help with lung inflammation.  Continue Mucinex, cough medications.  Seek care if symptoms do not improve with treatment.
